# Patient Record
Sex: Female | Born: 1990 | Race: Black or African American | Hispanic: No | Marital: Single | Smoking: Never smoker
Health system: Southern US, Community
[De-identification: ages and names within clinical notes are randomized; demographics above are authoritative.]

## PROBLEM LIST (undated history)

## (undated) DIAGNOSIS — N12 Tubulo-interstitial nephritis, not specified as acute or chronic: Secondary | ICD-10-CM

## (undated) DIAGNOSIS — Z789 Other specified health status: Secondary | ICD-10-CM

---

## 2012-01-25 DIAGNOSIS — S99929A Unspecified injury of unspecified foot, initial encounter: Secondary | ICD-10-CM | POA: Insufficient documentation

## 2012-01-25 DIAGNOSIS — X500XXA Overexertion from strenuous movement or load, initial encounter: Secondary | ICD-10-CM | POA: Insufficient documentation

## 2012-01-25 DIAGNOSIS — S8990XA Unspecified injury of unspecified lower leg, initial encounter: Secondary | ICD-10-CM | POA: Insufficient documentation

## 2012-01-26 ENCOUNTER — Emergency Department (HOSPITAL_BASED_OUTPATIENT_CLINIC_OR_DEPARTMENT_OTHER)
Admission: EM | Admit: 2012-01-26 | Discharge: 2012-01-26 | Disposition: A | Discharge status: Home / Self Care | Payer: Medicaid Other | Attending: Emergency Medicine | Admitting: Emergency Medicine

## 2012-01-26 ENCOUNTER — Emergency Department (HOSPITAL_BASED_OUTPATIENT_CLINIC_OR_DEPARTMENT_OTHER): Payer: Medicaid Other

## 2012-01-26 ENCOUNTER — Encounter (HOSPITAL_BASED_OUTPATIENT_CLINIC_OR_DEPARTMENT_OTHER): Payer: Self-pay | Admitting: *Deleted

## 2012-01-26 DIAGNOSIS — S8392XA Sprain of unspecified site of left knee, initial encounter: Secondary | ICD-10-CM

## 2012-01-26 MED ORDER — NAPROXEN SODIUM 220 MG PO TABS: 220.0000 mg | ORAL_TABLET | Freq: Two times a day (BID) | ORAL | Status: AC

## 2012-01-26 MED ORDER — HYDROCODONE-ACETAMINOPHEN 5-325 MG PO TABS: 1.0000 | ORAL_TABLET | Freq: Four times a day (QID) | ORAL | Status: AC | PRN

## 2012-01-26 MED ORDER — HYDROCODONE-ACETAMINOPHEN 5-325 MG PO TABS
1.0000 | ORAL_TABLET | Freq: Once | ORAL | Status: AC
  Administered 2012-01-26: 1 via ORAL
  Filled 2012-01-26: qty 1

## 2012-01-26 MED ORDER — NAPROXEN 250 MG PO TABS
500.0000 mg | ORAL_TABLET | Freq: Once | ORAL | Status: AC
  Administered 2012-01-26: 500 mg via ORAL
  Filled 2012-01-26: qty 2
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
## 2012-01-26 NOTE — ED Provider Notes (Signed)
History     CSN: [id number]  Arrival date & time 01/25/12  2358   First MD Initiated Contact with Patient 01/26/12 0116      Chief Complaint  Patient presents with  . Knee Injury    (Consider location/radiation/quality/duration/timing/severity/associated sxs/prior treatment) HPI This is a 21 year old black female who twisted her left knee 5 days ago. Since then she has had swelling and pain in the medial aspect of the knee that has worsened. It is moderate in severity and worse with ambulation. She also complains that her knee "gives" sometimes while walking. She was seen at [facility] after the injury and was told to wear an Ace bandage. She's not on any medications for it. She denies other injury.  History reviewed. No pertinent past medical history.  History reviewed. No pertinent past surgical history.  History reviewed. No pertinent family history.  History  Substance Use Topics  . Smoking status: Never Smoker   . Smokeless tobacco: Not on file  . Alcohol Use: No    OB History    Grav Para Term Preterm Abortions TAB SAB Ect Mult Living                  Review of Systems  Allergies  Review of patient's allergies indicates no known allergies.  Home Medications  No current outpatient prescriptions on file.  BP 138/93  Pulse 83  Temp(Src) 98.3 F (36.8 C) (Oral)  Resp 19  Ht 5\' 3"  (1.6 m)  Wt 197 lb (89.359 kg)  BMI 34.90 kg/m2  SpO2 100%  Physical Exam General: Well-developed, well-nourished female in no acute distress; appearance consistent with age of record HENT: normocephalic, atraumatic Eyes: Normal appearance Neck: supple Heart: regular rate and rhythm Lungs: Normal respiratory effort and excursion Abdomen: soft; nondistended Extremities: No deformity; full range of motion; pulses normal; tenderness over left medial collateral ligament with medial pain on passive range of motion; no instability of left knee; small left knee  effusion noted Neurologic: Awake, alert and oriented; motor function intact in all extremities and symmetric; no facial droop Skin: Warm and dry Psychiatric: Normal mood and affect    ED Course  Procedures (including critical care time)     MDM  Nursing notes and vitals signs, including pulse oximetry, reviewed.  Summary of this visit's results, reviewed by myself:  Labs:  No results found for this or any previous visit.  Imaging Studies: Dg Knee Complete 4 Views Left  01/26/2012  *RADIOLOGY REPORT*  Clinical Data: Injured left knee.  LEFT KNEE - COMPLETE 4+ VIEW  Comparison: None  Findings: The joint spaces are maintained.  No acute fracture or osteochondral abnormality.  A moderate sized joint effusion is noted.  IMPRESSION:  1.  No acute bony findings. 2.  Moderate sized joint effusion.  Original Report Authenticated By: P. Loralie Champagne, M.D.            Hanley Seamen, MD 01/26/12 [phone]

## 2012-02-10 ENCOUNTER — Encounter (HOSPITAL_BASED_OUTPATIENT_CLINIC_OR_DEPARTMENT_OTHER): Payer: Self-pay | Admitting: *Deleted

## 2012-02-10 ENCOUNTER — Emergency Department (HOSPITAL_BASED_OUTPATIENT_CLINIC_OR_DEPARTMENT_OTHER)
Admission: EM | Admit: 2012-02-10 | Discharge: 2012-02-10 | Disposition: A | Discharge status: Home / Self Care | Payer: Medicaid Other | Attending: Emergency Medicine | Admitting: Emergency Medicine

## 2012-02-10 ENCOUNTER — Emergency Department (HOSPITAL_BASED_OUTPATIENT_CLINIC_OR_DEPARTMENT_OTHER): Payer: Medicaid Other

## 2012-02-10 DIAGNOSIS — S161XXA Strain of muscle, fascia and tendon at neck level, initial encounter: Secondary | ICD-10-CM

## 2012-02-10 DIAGNOSIS — R51 Headache: Secondary | ICD-10-CM | POA: Insufficient documentation

## 2012-02-10 DIAGNOSIS — S139XXA Sprain of joints and ligaments of unspecified parts of neck, initial encounter: Secondary | ICD-10-CM | POA: Insufficient documentation

## 2012-02-10 DIAGNOSIS — G8929 Other chronic pain: Secondary | ICD-10-CM | POA: Insufficient documentation

## 2012-02-10 DIAGNOSIS — M542 Cervicalgia: Secondary | ICD-10-CM | POA: Insufficient documentation

## 2012-02-10 DIAGNOSIS — M25569 Pain in unspecified knee: Secondary | ICD-10-CM | POA: Insufficient documentation

## 2012-02-10 MED ORDER — TRAMADOL HCL 50 MG PO TABS: 50.0000 mg | ORAL_TABLET | Freq: Four times a day (QID) | ORAL | Status: AC | PRN

## 2012-02-10 MED ORDER — TRAMADOL HCL 50 MG PO TABS
ORAL_TABLET | ORAL | Status: AC
  Administered 2012-02-10: 50 mg
  Filled 2012-02-10: qty 1

## 2012-02-10 NOTE — ED Notes (Signed)
Patient transported to CT 
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
## 2012-02-10 NOTE — Discharge Instructions (Signed)
Cervical Strain and Sprain (Whiplash) with Rehab Cervical strain and sprains are injuries that commonly occur with "whiplash" injuries. Whiplash occurs when the neck is forcefully whipped backward or forward, such as during a motor vehicle accident. The muscles, ligaments, tendons, discs and nerves of the neck are susceptible to injury when this occurs. SYMPTOMS   Pain or stiffness in the front and/or back of neck   Symptoms may present immediately or up to 24 hours after injury.   Dizziness, headache, nausea and vomiting.   Muscle spasm with soreness and stiffness in the neck.   Tenderness and swelling at the injury site.  CAUSES  Whiplash injuries often occur during contact sports or motor vehicle accidents.  RISK INCREASES WITH:  Osteoarthritis of the spine.   Situations that make head or neck accidents or trauma more likely.   High-risk sports (football, rugby, wrestling, hockey, auto racing, gymnastics, diving, contact karate or boxing).   Poor strength and flexibility of the neck.   Previous neck injury.   Poor tackling technique.   Improperly fitted or padded equipment.  PREVENTION  Learn and use proper technique (avoid tackling with the head, spearing and head-butting; use proper falling techniques to avoid landing on the head).   Warm up and stretch properly before activity.   Maintain physical fitness:   Strength, flexibility and endurance.   Cardiovascular fitness.   Wear properly fitted and padded protective equipment, such as padded soft collars, for participation in contact sports.  PROGNOSIS  Recovery for cervical strain and sprain injuries is dependent on the extent of the injury. These injuries are usually curable in 1 week to 3 months with appropriate treatment.  RELATED COMPLICATIONS   Temporary numbness and weakness may occur if the nerve roots are damaged, and this may persist until the nerve has completely healed.   Chronic pain due to frequent  recurrence of symptoms.   Prolonged healing, especially if activity is resumed too soon (before complete recovery).  TREATMENT  Treatment initially involves the use of ice and medication to help reduce pain and inflammation. It is also important to perform strengthening and stretching exercises and modify activities that worsen symptoms so the injury does not get worse. These exercises may be performed at home or with a therapist. For patients who experience severe symptoms, a soft padded collar may be recommended to be worn around the neck.  Improving your posture may help reduce symptoms. Posture improvement includes pulling your chin and abdomen in while sitting or standing. If you are sitting, sit in a firm chair with your buttocks against the back of the chair. While sleeping, try replacing your pillow with a small towel rolled to 2 inches in diameter, or use a cervical pillow or soft cervical collar. Poor sleeping positions delay healing.  For patients with nerve root damage, which causes numbness or weakness, the use of a cervical traction apparatus may be recommended. Surgery is rarely necessary for these injuries. However, cervical strain and sprains that are present at birth (congenital) may require surgery. MEDICATION   If pain medication is necessary, nonsteroidal anti-inflammatory medications, such as aspirin and ibuprofen, or other minor pain relievers, such as acetaminophen, are often recommended.   Do not take pain medication for 7 days before surgery.   Prescription pain relievers may be given if deemed necessary by your caregiver. Use only as directed and only as much as you need.  HEAT AND COLD:   Cold treatment (icing) relieves pain and reduces inflammation. Cold  treatment should be applied for 10 to 15 minutes every 2 to 3 hours for inflammation and pain and immediately after any activity that aggravates your symptoms. Use ice packs or an ice massage.   Heat treatment may be  used prior to performing the stretching and strengthening activities prescribed by your caregiver, physical therapist, or athletic trainer. Use a heat pack or a warm soak.  SEEK MEDICAL CARE IF:   Symptoms get worse or do not improve in 2 weeks despite treatment.   New, unexplained symptoms develop (drugs used in treatment may produce side effects).  EXERCISES RANGE OF MOTION (ROM) AND STRETCHING EXERCISES - Cervical Strain and Sprain These exercises may help you when beginning to rehabilitate your injury. In order to successfully resolve your symptoms, you must improve your posture. These exercises are designed to help reduce the forward-head and rounded-shoulder posture which contributes to this condition. Your symptoms may resolve with or without further involvement from your physician, physical therapist or athletic trainer. While completing these exercises, remember:   Restoring tissue flexibility helps normal motion to return to the joints. This allows healthier, less painful movement and activity.   An effective stretch should be held for at least 20 seconds, although you may need to begin with shorter hold times for comfort.   A stretch should never be painful. You should only feel a gentle lengthening or release in the stretched tissue.  STRETCH- Axial Extensors  Lie on your back on the floor. You may bend your knees for comfort. Place a rolled up hand towel or dish towel, about 2 inches in diameter, under the part of your head that makes contact with the floor.   Gently tuck your chin, as if trying to make a "double chin," until you feel a gentle stretch at the base of your head.   Hold __________ seconds.  Repeat __________ times. Complete this exercise __________ times per day.  STRETECH - Axial Extension   Stand or sit on a firm surface. Assume a good posture: chest up, shoulders drawn back, abdominal muscles slightly tense, knees unlocked (if standing) and feet hip width apart.    Slowly retract your chin so your head slides back and your chin slightly lowers.Continue to look straight ahead.   You should feel a gentle stretch in the back of your head. Be certain not to feel an aggressive stretch since this can cause headaches later.   Hold for __________ seconds.  Repeat __________ times. Complete this exercise __________ times per day. STRETCH - Cervical Side Bend   Stand or sit on a firm surface. Assume a good posture: chest up, shoulders drawn back, abdominal muscles slightly tense, knees unlocked (if standing) and feet hip width apart.   Without letting your nose or shoulders move, slowly tip your right / left ear to your shoulder until your feel a gentle stretch in the muscles on the opposite side of your neck.   Hold __________ seconds.  Repeat __________ times. Complete this exercise __________ times per day. STRETCH - Cervical Rotators   Stand or sit on a firm surface. Assume a good posture: chest up, shoulders drawn back, abdominal muscles slightly tense, knees unlocked (if standing) and feet hip width apart.   Keeping your eyes level with the ground, slowly turn your head until you feel a gentle stretch along the back and opposite side of your neck.   Hold __________ seconds.  Repeat __________ times. Complete this exercise __________ times per day. RANGE OF  MOTION - Neck Circles   Stand or sit on a firm surface. Assume a good posture: chest up, shoulders drawn back, abdominal muscles slightly tense, knees unlocked (if standing) and feet hip width apart.   Gently roll your head down and around from the back of one shoulder to the back of the other. The motion should never be forced or painful.   Repeat the motion 10-20 times, or until you feel the neck muscles relax and loosen.  Repeat __________ times. Complete the exercise __________ times per day. STRENGTHENING EXERCISES - Cervical Strain and Sprain These exercises may help you when beginning to  rehabilitate your injury. They may resolve your symptoms with or without further involvement from your physician, physical therapist or athletic trainer. While completing these exercises, remember:   Muscles can gain both the endurance and the strength needed for everyday activities through controlled exercises.   Complete these exercises as instructed by your physician, physical therapist or athletic trainer. Progress the resistance and repetitions only as guided.   You may experience muscle soreness or fatigue, but the pain or discomfort you are trying to eliminate should never worsen during these exercises. If this pain does worsen, stop and make certain you are following the directions exactly. If the pain is still present after adjustments, discontinue the exercise until you can discuss the trouble with your clinician.  STRENGTH - Cervical Flexors, Isometric  Face a wall, standing about 6 inches away. Place a small pillow, a ball about 6-8 inches in diameter, or a folded towel between your forehead and the wall.   Slightly tuck your chin and gently push your forehead into the soft object. Push only with mild to moderate intensity, building up tension gradually. Keep your jaw and forehead relaxed.   Hold 10 to 20 seconds. Keep your breathing relaxed.   Release the tension slowly. Relax your neck muscles completely before you start the next repetition.  Repeat __________ times. Complete this exercise __________ times per day. STRENGTH- Cervical Lateral Flexors, Isometric   Stand about 6 inches away from a wall. Place a small pillow, a ball about 6-8 inches in diameter, or a folded towel between the side of your head and the wall.   Slightly tuck your chin and gently tilt your head into the soft object. Push only with mild to moderate intensity, building up tension gradually. Keep your jaw and forehead relaxed.   Hold 10 to 20 seconds. Keep your breathing relaxed.   Release the tension  slowly. Relax your neck muscles completely before you start the next repetition.  Repeat __________ times. Complete this exercise __________ times per day. STRENGTH - Cervical Extensors, Isometric   Stand about 6 inches away from a wall. Place a small pillow, a ball about 6-8 inches in diameter, or a folded towel between the back of your head and the wall.   Slightly tuck your chin and gently tilt your head back into the soft object. Push only with mild to moderate intensity, building up tension gradually. Keep your jaw and forehead relaxed.   Hold 10 to 20 seconds. Keep your breathing relaxed.   Release the tension slowly. Relax your neck muscles completely before you start the next repetition.  Repeat __________ times. Complete this exercise __________ times per day. POSTURE AND BODY MECHANICS CONSIDERATIONS - Cervical Strain and Sprain Keeping correct posture when sitting, standing or completing your activities will reduce the stress put on different body tissues, allowing injured tissues a chance to heal  and limiting painful experiences. The following are general guidelines for improved posture. Your physician or physical therapist will provide you with any instructions specific to your needs. While reading these guidelines, remember:  The exercises prescribed by your provider will help you have the flexibility and strength to maintain correct postures.   The correct posture provides the optimal environment for your joints to work. All of your joints have less wear and tear when properly supported by a spine with good posture. This means you will experience a healthier, less painful body.   Correct posture must be practiced with all of your activities, especially prolonged sitting and standing. Correct posture is as important when doing repetitive low-stress activities (typing) as it is when doing a single heavy-load activity (lifting).  PROLONGED STANDING WHILE SLIGHTLY LEANING FORWARD When  completing a task that requires you to lean forward while standing in one place for a long time, place either foot up on a stationary 2-4 inch high object to help maintain the best posture. When both feet are on the ground, the low back tends to lose its slight inward curve. If this curve flattens (or becomes too large), then the back and your other joints will experience too much stress, fatigue more quickly and can cause pain.  RESTING POSITIONS Consider which positions are most painful for you when choosing a resting position. If you have pain with flexion-based activities (sitting, bending, stooping, squatting), choose a position that allows you to rest in a less flexed posture. You would want to avoid curling into a fetal position on your side. If your pain worsens with extension-based activities (prolonged standing, working overhead), avoid resting in an extended position such as sleeping on your stomach. Most people will find more comfort when they rest with their spine in a more neutral position, neither too rounded nor too arched. Lying on a non-sagging bed on your side with a pillow between your knees, or on your back with a pillow under your knees will often provide some relief. Keep in mind, being in any one position for a prolonged period of time, no matter how correct your posture, can still lead to stiffness. WALKING Walk with an upright posture. Your ears, shoulders and hips should all line-up. OFFICE WORK When working at a desk, create an environment that supports good, upright posture. Without extra support, muscles fatigue and lead to excessive strain on joints and other tissues. CHAIR:  A chair should be able to slide under your desk when your back makes contact with the back of the chair. This allows you to work closely.   The chair's height should allow your eyes to be level with the upper part of your monitor and your hands to be slightly lower than your elbows.   Body position:     Your feet should make contact with the floor. If this is not possible, use a foot rest.   Keep your ears over your shoulders. This will reduce stress on your neck and low back.  Document Released: 08/10/2005 Document Revised: 07/30/2011 Document Reviewed: 11/22/2008 [facility] Patient Information 2012 [place], [place].

## 2012-12-29 ENCOUNTER — Emergency Department (HOSPITAL_BASED_OUTPATIENT_CLINIC_OR_DEPARTMENT_OTHER): Payer: Medicaid Other

## 2012-12-29 ENCOUNTER — Encounter (HOSPITAL_BASED_OUTPATIENT_CLINIC_OR_DEPARTMENT_OTHER): Payer: Self-pay | Admitting: *Deleted

## 2012-12-29 ENCOUNTER — Emergency Department (HOSPITAL_BASED_OUTPATIENT_CLINIC_OR_DEPARTMENT_OTHER)
Admission: EM | Admit: 2012-12-29 | Discharge: 2012-12-29 | Disposition: A | Discharge status: Home / Self Care | Payer: Medicaid Other | Attending: Emergency Medicine | Admitting: Emergency Medicine

## 2012-12-29 DIAGNOSIS — O9989 Other specified diseases and conditions complicating pregnancy, childbirth and the puerperium: Secondary | ICD-10-CM | POA: Insufficient documentation

## 2012-12-29 DIAGNOSIS — R109 Unspecified abdominal pain: Secondary | ICD-10-CM | POA: Insufficient documentation

## 2012-12-29 DIAGNOSIS — Z3490 Encounter for supervision of normal pregnancy, unspecified, unspecified trimester: Secondary | ICD-10-CM

## 2012-12-29 LAB — WET PREP, GENITAL

## 2012-12-29 LAB — URINALYSIS, ROUTINE W REFLEX MICROSCOPIC
Bilirubin Urine: NEGATIVE
Glucose, UA: NEGATIVE mg/dL
Hgb urine dipstick: NEGATIVE
Ketones, ur: NEGATIVE mg/dL
Protein, ur: NEGATIVE mg/dL

## 2012-12-29 LAB — HCG, QUANTITATIVE, PREGNANCY: hCG, Beta Chain, Quant, S: 3937 m[IU]/mL — ABNORMAL HIGH (ref <5)

## 2012-12-29 MED ORDER — PRENATAL COMPLETE 14-0.4 MG PO TABS: ORAL_TABLET | ORAL | Status: DC

## 2012-12-29 NOTE — ED Provider Notes (Signed)
History     CSN: [id number]  Arrival date & time 12/29/12  1947   First MD Initiated Contact with Patient 12/29/12 2003      Chief Complaint  Patient presents with  . Abdominal Pain    (Consider location/radiation/quality/duration/timing/severity/associated sxs/prior treatment) Patient is a 22 y.o. female presenting with abdominal pain. The history is provided by the patient. No language interpreter was used.  Abdominal Pain Pain location:  Suprapubic Pain quality: aching   Pain radiates to:  Does not radiate Onset quality:  Gradual Chronicity:  New Relieved by:  Nothing Ineffective treatments:  None tried Pt complains of pain in her lower abdomen.  Pt reports her home pregnancy test was positive  History reviewed. No pertinent past medical history.  History reviewed. No pertinent past surgical history.  No family history on file.  History  Substance Use Topics  . Smoking status: Never Smoker   . Smokeless tobacco: Never Used  . Alcohol Use: No    OB History   Grav Para Term Preterm Abortions TAB SAB Ect Mult Living                  Review of Systems  Unable to perform ROS Gastrointestinal: Positive for abdominal pain.  All other systems reviewed and are negative.    Allergies  Review of patient's allergies indicates no known allergies.  Home Medications   Current Outpatient Rx  Name  Route  Sig  Dispense  Refill  . naproxen sodium (ALEVE) 220 MG tablet   Oral   Take 1 tablet (220 mg total) by mouth 2 (two) times daily with a meal.           BP 154/107  Pulse 90  Temp(Src) 98.7 F (37.1 C) (Oral)  Resp 20  SpO2 98%  LMP 11/28/2012  Physical Exam  Nursing note and vitals reviewed. HENT:  Head: Normocephalic.  Right Ear: External ear normal.  Left Ear: External ear normal.  Eyes: Conjunctivae and EOM are normal. Pupils are equal, round, and reactive to light.  Neck: Normal range of motion. Neck supple.  Cardiovascular: Normal rate and  normal heart sounds.   Pulmonary/Chest: Effort normal and breath sounds normal.  Abdominal: Soft. Bowel sounds are normal.  Genitourinary: Vagina normal and uterus normal.  Musculoskeletal: Normal range of motion.  Skin: Skin is warm.    ED Course  Procedures (including critical care time)  Labs Reviewed  PREGNANCY, URINE - Abnormal; Notable for the following:    Preg Test, Ur POSITIVE (*)    All other components within normal limits  URINALYSIS, ROUTINE W REFLEX MICROSCOPIC - Abnormal; Notable for the following:    Specific Gravity, Urine 1.037 (*)    All other components within normal limits  WET PREP, GENITAL  GC/CHLAMYDIA PROBE AMP   No results found.   1. Pregnancy       MDM   Results for orders placed during the hospital encounter of 12/29/12  PREGNANCY, URINE      Result Value Range   Preg Test, Ur POSITIVE (*) NEGATIVE  URINALYSIS, ROUTINE W REFLEX MICROSCOPIC      Result Value Range   Color, Urine YELLOW  YELLOW   APPearance CLEAR  CLEAR   Specific Gravity, Urine 1.037 (*) 1.005 - 1.030   pH 5.5  5.0 - 8.0   Glucose, UA NEGATIVE  NEGATIVE mg/dL   Hgb urine dipstick NEGATIVE  NEGATIVE   Bilirubin Urine NEGATIVE  NEGATIVE   Ketones, ur NEGATIVE  NEGATIVE mg/dL   Protein, ur NEGATIVE  NEGATIVE mg/dL   Urobilinogen, UA 0.2  0.0 - 1.0 mg/dL   Nitrite NEGATIVE  NEGATIVE   Leukocytes, UA NEGATIVE  NEGATIVE   US Ob Comp Less 14 Wks  12/29/2012  *RADIOLOGY REPORT*  Clinical Data: Abdominal pain  OBSTETRIC <14 WK ULTRASOUND  Technique:  Transabdominal ultrasound was performed for evaluation of the gestation as well as the maternal uterus and adnexal regions.  Comparison:  None.  Intrauterine gestational sac: Single sac like structure within the fundal aspect of the endometrium. Yolk sac: Not identified Embryo: Not identified  MSD: 3.6 mm  5 w  1 d               [place] EDC: 08/30/2013  Maternal uterus/Adnexae: Normal ovaries.  No free fluid.  IMPRESSION: Probable early  intrauterine gestational sac, but no yolk sac, fetal pole, or cardiac activity yet visualized.  Recommend follow-up quantitative B-HCG levels and follow-up US in 14 days to confirm and assess viability. This recommendation follows SRU consensus guidelines: Diagnostic Criteria for Nonviable Pregnancy Early in the First Trimester.  Malva Limes Med 2013; 161:0960-45.   Original Report Authenticated By: Genevive Bi, M.D.    US Ob Transvaginal  12/29/2012  *RADIOLOGY REPORT*  Clinical Data: Abdominal pain  OBSTETRIC <14 WK ULTRASOUND  Technique:  Transabdominal ultrasound was performed for evaluation of the gestation as well as the maternal uterus and adnexal regions.  Comparison:  None.  Intrauterine gestational sac: Single sac like structure within the fundal aspect of the endometrium. Yolk sac: Not identified Embryo: Not identified  MSD: 3.6 mm  5 w  1 d               [place] EDC: 08/30/2013  Maternal uterus/Adnexae: Normal ovaries.  No free fluid.  IMPRESSION: Probable early intrauterine gestational sac, but no yolk sac, fetal pole, or cardiac activity yet visualized.  Recommend follow-up quantitative B-HCG levels and follow-up US in 14 days to confirm and assess viability. This recommendation follows SRU consensus guidelines: Diagnostic Criteria for Nonviable Pregnancy Early in the First Trimester.  Malva Limes Med 2013; 409:8119-14.   Original Report Authenticated By: Genevive Bi, M.D.       Pt advised of results.  Pt advised to follow up at [facility] hospital in 2 days for repeat bhcg and 2 week ultrasound     Elson Areas, PA-C 12/29/12 2236

## 2012-12-29 NOTE — ED Provider Notes (Signed)
Medical screening examination/treatment/procedure(s) were performed by non-physician practitioner and as supervising physician I was immediately available for consultation/collaboration.   Dent Plantz, MD 12/29/12 2253 

## 2012-12-29 NOTE — ED Notes (Signed)
Patient transported to Ultrasound 

## 2012-12-29 NOTE — ED Notes (Signed)
abd pain since Monday- pt reports 2 positive home preg tests

## 2012-12-31 LAB — GC/CHLAMYDIA PROBE AMP: CT Probe RNA: NEGATIVE
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
## 2013-10-20 ENCOUNTER — Encounter (HOSPITAL_COMMUNITY): Payer: Self-pay | Admitting: *Deleted

## 2013-10-20 ENCOUNTER — Inpatient Hospital Stay (HOSPITAL_COMMUNITY)
Admission: AD | Admit: 2013-10-20 | Discharge: 2013-10-20 | Disposition: A | Discharge status: Home / Self Care | Payer: Medicaid Other | Source: Ambulatory Visit | Attending: Obstetrics & Gynecology | Admitting: Obstetrics & Gynecology

## 2013-10-20 DIAGNOSIS — N938 Other specified abnormal uterine and vaginal bleeding: Secondary | ICD-10-CM | POA: Insufficient documentation

## 2013-10-20 DIAGNOSIS — N939 Abnormal uterine and vaginal bleeding, unspecified: Secondary | ICD-10-CM

## 2013-10-20 DIAGNOSIS — N760 Acute vaginitis: Secondary | ICD-10-CM

## 2013-10-20 DIAGNOSIS — B9689 Other specified bacterial agents as the cause of diseases classified elsewhere: Secondary | ICD-10-CM

## 2013-10-20 DIAGNOSIS — A499 Bacterial infection, unspecified: Secondary | ICD-10-CM | POA: Insufficient documentation

## 2013-10-20 DIAGNOSIS — N949 Unspecified condition associated with female genital organs and menstrual cycle: Secondary | ICD-10-CM | POA: Insufficient documentation

## 2013-10-20 HISTORY — DX: Other specified health status: Z78.9

## 2013-10-20 LAB — CBC
HCT: 37.8 % (ref 36.0–46.0)
Hemoglobin: 13 g/dL (ref 12.0–15.0)
MCH: 29.5 pg (ref 26.0–34.0)
MCHC: 34.4 g/dL (ref 30.0–36.0)
MCV: 85.7 fL (ref 78.0–100.0)
PLATELETS: 285 10*3/uL (ref 150–400)
RBC: 4.41 MIL/uL (ref 3.87–5.11)
RDW: 15 % (ref 11.5–15.5)
WBC: 6.7 10*3/uL (ref 4.0–10.5)

## 2013-10-20 LAB — URINALYSIS, ROUTINE W REFLEX MICROSCOPIC
BILIRUBIN URINE: NEGATIVE
GLUCOSE, UA: NEGATIVE mg/dL
Ketones, ur: NEGATIVE mg/dL
Leukocytes, UA: NEGATIVE
Nitrite: NEGATIVE
PH: 5.5 (ref 5.0–8.0)
Protein, ur: NEGATIVE mg/dL
UROBILINOGEN UA: 0.2 mg/dL (ref 0.0–1.0)

## 2013-10-20 LAB — URINE MICROSCOPIC-ADD ON

## 2013-10-20 LAB — WET PREP, GENITAL
TRICH WET PREP: NONE SEEN
YEAST WET PREP: NONE SEEN

## 2013-10-20 LAB — POCT PREGNANCY, URINE: PREG TEST UR: NEGATIVE

## 2013-10-20 MED ORDER — METRONIDAZOLE 500 MG PO TABS: 500.0000 mg | ORAL_TABLET | Freq: Two times a day (BID) | ORAL | Status: DC

## 2013-10-20 NOTE — MAU Provider Note (Signed)
History     CSN: [id number]  Arrival date and time: 10/20/13 1814   First Provider Initiated Contact with Patient 10/20/13 1915      Chief Complaint  Patient presents with  . Vaginal Discharge  . Vaginal Bleeding   HPI  [name] is a 23 y.o. female G2P1011 who presents with concerns of vaginal discharge and vaginal bleeding. She delivered via c-section on January 7th at [facility]. She believes she started her period in February; the bleeding lasted for 7 days. Two days ago she started spotting again, along with an odorous discharge. She is not concerned about STDS; she is breast feeding.   OB History   Grav Para Term Preterm Abortions TAB SAB Ect Mult Living   2 1 1  1     1       Past Medical History  Diagnosis Date  . Medical history non-contributory     Past Surgical History  Procedure Laterality Date  . Cesarean section      History reviewed. No pertinent family history.  History  Substance Use Topics  . Smoking status: Never Smoker   . Smokeless tobacco: Never Used  . Alcohol Use: No    Allergies: No Known Allergies  Prescriptions prior to admission  Medication Sig Dispense Refill  . Prenatal Vit-Fe Fumarate-FA (PRENATAL MULTIVITAMIN) TABS tablet Take 1 tablet by mouth daily at 12 noon.       Results for orders placed during the hospital encounter of 10/20/13 (from the past 48 hour(s))  POCT PREGNANCY, URINE     Status: None   Collection Time    10/20/13  7:01 PM      Result Value Ref Range   Preg Test, Ur NEGATIVE  NEGATIVE   Comment:            THE SENSITIVITY OF THIS     METHODOLOGY IS >24 mIU/mL   Results for orders placed during the hospital encounter of 10/20/13 (from the past 48 hour(s))  URINALYSIS, ROUTINE W REFLEX MICROSCOPIC     Status: Abnormal   Collection Time    10/20/13  6:55 PM      Result Value Ref Range   Color, Urine YELLOW  YELLOW   APPearance CLEAR  CLEAR   Specific Gravity, Urine >1.030 (*) 1.005 - 1.030   pH  5.5  5.0 - 8.0   Glucose, UA NEGATIVE  NEGATIVE mg/dL   Hgb urine dipstick LARGE (*) NEGATIVE   Bilirubin Urine NEGATIVE  NEGATIVE   Ketones, ur NEGATIVE  NEGATIVE mg/dL   Protein, ur NEGATIVE  NEGATIVE mg/dL   Urobilinogen, UA 0.2  0.0 - 1.0 mg/dL   Nitrite NEGATIVE  NEGATIVE   Leukocytes, UA NEGATIVE  NEGATIVE  URINE MICROSCOPIC-ADD ON     Status: Abnormal   Collection Time    10/20/13  6:55 PM      Result Value Ref Range   Squamous Epithelial / LPF MANY (*) RARE   WBC, UA 0-2  <3 WBC/hpf   RBC / HPF 0-2  <3 RBC/hpf   Bacteria, UA FEW (*) RARE  POCT PREGNANCY, URINE     Status: None   Collection Time    10/20/13  7:01 PM      Result Value Ref Range   Preg Test, Ur NEGATIVE  NEGATIVE   Comment:            THE SENSITIVITY OF THIS     METHODOLOGY IS >24 mIU/mL  WET PREP, GENITAL  Status: Abnormal   Collection Time    10/20/13  7:30 PM      Result Value Ref Range   Yeast Wet Prep HPF POC NONE SEEN  NONE SEEN   Trich, Wet Prep NONE SEEN  NONE SEEN   Clue Cells Wet Prep HPF POC FEW (*) NONE SEEN   WBC, Wet Prep HPF POC RARE (*) NONE SEEN   Comment: FEW BACTERIA SEEN  CBC     Status: None   Collection Time    10/20/13  7:30 PM      Result Value Ref Range   WBC 6.7  4.0 - 10.5 K/uL   RBC 4.41  3.87 - 5.11 MIL/uL   Hemoglobin 13.0  12.0 - 15.0 g/dL   HCT 16.1  09.6 - 04.5 %   MCV 85.7  78.0 - 100.0 fL   MCH 29.5  26.0 - 34.0 pg   MCHC 34.4  30.0 - 36.0 g/dL   RDW 40.9  81.1 - 91.4 %   Platelets 285  150 - 400 K/uL    Review of Systems  Constitutional: Positive for chills. Negative for fever.  Gastrointestinal: Negative for nausea, vomiting, abdominal pain, diarrhea and constipation.  Genitourinary: Negative for dysuria, urgency, frequency and hematuria.       + vaginal discharge. + vaginal bleeding. No dysuria.    Physical Exam   Blood pressure 131/97, pulse 67, temperature 98.6 F (37 C), temperature source Oral, resp. rate 18, height 5\' 3"  (1.6 m), weight  105.688 kg (233 lb), last menstrual period 10/04/2013, SpO2 100.00%.  Physical Exam  Constitutional: She is oriented to person, place, and time. She appears well-developed and well-nourished. No distress.  HENT:  Head: Normocephalic.  Eyes: Pupils are equal, round, and reactive to light.  Neck: Neck supple.  Respiratory: Effort normal.  GI: Soft. She exhibits no distension and no mass. There is no tenderness. There is no rebound and no guarding.  Genitourinary: Vaginal discharge found.  Speculum exam: Vagina - Scant amount of dark red blood, strong odor  Cervix - No contact bleeding, no active bleeding  Bimanual exam: Cervix closed, no CMT  Uterus non tender, normal size Adnexa non tender, no masses bilaterally GC/Chlam, wet prep done Chaperone present for exam.   Musculoskeletal: Normal range of motion.  Neurological: She is alert and oriented to person, place, and time.  Skin: Skin is warm. She is not diaphoretic.  Psychiatric: Her behavior is normal.    MAU Course  Procedures None  MDM CBC UPT UA Wet prep GC/Chlamydia- pending   Assessment and Plan   A:  Irregular vaginal bleeding  Bacterial vaginosis   P:  Discharge home in stable condition RX: Flagyl  Return to MAU as needed if symptoms worsen  Continue prenatal vitamins   Priscilla Hansen Adreena Willits, NP  10/23/2013, 4:38 PM

## 2013-10-20 NOTE — Discharge Instructions (Signed)

## 2013-10-20 NOTE — MAU Note (Signed)
Patient states she had a cesarean section on 1-7. States she thinks she had a period on 2-16 then started spotting 2 days ago. Has had a vaginal discharge with odor for about one week.

## 2013-10-21 LAB — GC/CHLAMYDIA PROBE AMP
CT Probe RNA: NEGATIVE
GC Probe RNA: NEGATIVE

## 2014-02-25 ENCOUNTER — Emergency Department (HOSPITAL_BASED_OUTPATIENT_CLINIC_OR_DEPARTMENT_OTHER)
Admission: EM | Admit: 2014-02-25 | Discharge: 2014-02-25 | Discharge status: Against Medical Advice | Payer: Medicaid Other | Attending: Emergency Medicine | Admitting: Emergency Medicine

## 2014-02-25 ENCOUNTER — Encounter (HOSPITAL_BASED_OUTPATIENT_CLINIC_OR_DEPARTMENT_OTHER): Payer: Self-pay | Admitting: Emergency Medicine

## 2014-02-25 DIAGNOSIS — M549 Dorsalgia, unspecified: Secondary | ICD-10-CM | POA: Diagnosis not present

## 2014-02-25 NOTE — ED Notes (Signed)
Patient has been called and didn't answer. 

## 2014-02-25 NOTE — ED Notes (Signed)
Reports since delivering in January, pt has back pain.  Reports pain is daily but the severity is different each day. Denies taking medication. Denies urinary symptoms.

## 2014-06-25 ENCOUNTER — Encounter (HOSPITAL_BASED_OUTPATIENT_CLINIC_OR_DEPARTMENT_OTHER): Payer: Self-pay | Admitting: Emergency Medicine

## 2015-09-07 ENCOUNTER — Encounter (HOSPITAL_BASED_OUTPATIENT_CLINIC_OR_DEPARTMENT_OTHER): Payer: Self-pay | Admitting: Emergency Medicine

## 2015-09-07 ENCOUNTER — Emergency Department (HOSPITAL_BASED_OUTPATIENT_CLINIC_OR_DEPARTMENT_OTHER)
Admission: EM | Admit: 2015-09-07 | Discharge: 2015-09-07 | Disposition: A | Discharge status: Home / Self Care | Payer: Medicaid Other | Attending: Emergency Medicine | Admitting: Emergency Medicine

## 2015-09-07 ENCOUNTER — Emergency Department (HOSPITAL_BASED_OUTPATIENT_CLINIC_OR_DEPARTMENT_OTHER): Payer: Medicaid Other

## 2015-09-07 DIAGNOSIS — N201 Calculus of ureter: Secondary | ICD-10-CM | POA: Insufficient documentation

## 2015-09-07 DIAGNOSIS — Z3202 Encounter for pregnancy test, result negative: Secondary | ICD-10-CM | POA: Diagnosis not present

## 2015-09-07 DIAGNOSIS — R109 Unspecified abdominal pain: Secondary | ICD-10-CM | POA: Diagnosis present

## 2015-09-07 LAB — URINE MICROSCOPIC-ADD ON

## 2015-09-07 LAB — URINALYSIS, ROUTINE W REFLEX MICROSCOPIC
Bilirubin Urine: NEGATIVE
GLUCOSE, UA: NEGATIVE mg/dL
KETONES UR: NEGATIVE mg/dL
LEUKOCYTES UA: NEGATIVE
NITRITE: NEGATIVE
PH: 6 (ref 5.0–8.0)
Protein, ur: NEGATIVE mg/dL
Specific Gravity, Urine: 1.027 (ref 1.005–1.030)

## 2015-09-07 LAB — CBC
HEMATOCRIT: 38.8 % (ref 36.0–46.0)
HEMOGLOBIN: 13.2 g/dL (ref 12.0–15.0)
MCH: 29.9 pg (ref 26.0–34.0)
MCHC: 34 g/dL (ref 30.0–36.0)
MCV: 87.8 fL (ref 78.0–100.0)
Platelets: 288 10*3/uL (ref 150–400)
RBC: 4.42 MIL/uL (ref 3.87–5.11)
RDW: 14.6 % (ref 11.5–15.5)
WBC: 8.9 10*3/uL (ref 4.0–10.5)

## 2015-09-07 LAB — COMPREHENSIVE METABOLIC PANEL
ALBUMIN: 4.7 g/dL (ref 3.5–5.0)
ALT: 24 U/L (ref 14–54)
ANION GAP: 10 (ref 5–15)
AST: 48 U/L — ABNORMAL HIGH (ref 15–41)
Alkaline Phosphatase: 55 U/L (ref 38–126)
BUN: 12 mg/dL (ref 6–20)
CHLORIDE: 109 mmol/L (ref 101–111)
CO2: 19 mmol/L — AB (ref 22–32)
Calcium: 9.4 mg/dL (ref 8.9–10.3)
Creatinine, Ser: 0.91 mg/dL (ref 0.44–1.00)
GFR calc non Af Amer: >60 mL/min (ref >60)
GLUCOSE: 125 mg/dL — AB (ref 65–99)
POTASSIUM: 3.7 mmol/L (ref 3.5–5.1)
SODIUM: 138 mmol/L (ref 135–145)
Total Bilirubin: 0.5 mg/dL (ref 0.3–1.2)
Total Protein: 8 g/dL (ref 6.5–8.1)

## 2015-09-07 LAB — PREGNANCY, URINE: Preg Test, Ur: NEGATIVE

## 2015-09-07 LAB — LIPASE, BLOOD: LIPASE: 28 U/L (ref 11–51)

## 2015-09-07 MED ORDER — TAMSULOSIN HCL 0.4 MG PO CAPS: 0.4000 mg | ORAL_CAPSULE | Freq: Every day | ORAL | Status: AC

## 2015-09-07 MED ORDER — ONDANSETRON HCL 4 MG/2ML IJ SOLN
4.0000 mg | Freq: Once | INTRAMUSCULAR | Status: AC
  Administered 2015-09-07: 4 mg via INTRAVENOUS
  Filled 2015-09-07: qty 2

## 2015-09-07 MED ORDER — OXYCODONE-ACETAMINOPHEN 5-325 MG PO TABS: 1.0000 | ORAL_TABLET | Freq: Four times a day (QID) | ORAL | Status: AC | PRN

## 2015-09-07 MED ORDER — KETOROLAC TROMETHAMINE 30 MG/ML IJ SOLN
30.0000 mg | Freq: Once | INTRAMUSCULAR | Status: AC
  Administered 2015-09-07: 30 mg via INTRAVENOUS
  Filled 2015-09-07: qty 1

## 2015-09-07 MED ORDER — NAPROXEN 500 MG PO TABS: 500.0000 mg | ORAL_TABLET | Freq: Two times a day (BID) | ORAL | Status: AC

## 2015-09-07 MED ORDER — HYDROMORPHONE HCL 1 MG/ML IJ SOLN
1.0000 mg | Freq: Once | INTRAMUSCULAR | Status: AC
  Administered 2015-09-07: 1 mg via INTRAVENOUS
  Filled 2015-09-07: qty 1

## 2015-09-07 NOTE — ED Notes (Signed)
Pt thrashing around in chair, reports inability to ambulate due to pain. Pt was informed to give urine sample, at which time she stood up and stated that it hurt to sit and that she could not give a sample. Pt ambulated to bathroom independently

## 2015-09-07 NOTE — ED Provider Notes (Signed)
CSN: [id number]     Arrival date & time 09/07/15  1454 History   First MD Initiated Contact with Patient 09/07/15 1627     Chief Complaint  Patient presents with  . Abdominal Pain     (Consider location/radiation/quality/duration/timing/severity/associated sxs/prior Treatment) HPI Comments: Patient with no serious past medical problems presents with complaint of left flank pain starting acutely one hour prior to arrival. Patient started having urinary frequency yesterday. No hematuria. No vaginal symptoms. No fevers. Patient has vomited multiple times. Onset of symptoms acute. Course is constant. Nothing makes symptoms better or worse. No previous abdominal surgeries other than cesarean section.  The history is provided by the patient.    Past Medical History  Diagnosis Date  . Medical history non-contributory    Past Surgical History  Procedure Laterality Date  . Cesarean section     History reviewed. No pertinent family history. Social History  Substance Use Topics  . Smoking status: Never Smoker   . Smokeless tobacco: Never Used  . Alcohol Use: No   OB History    Gravida Para Term Preterm AB TAB SAB Ectopic Multiple Living   2 1 1  1     1      Review of Systems  Constitutional: Negative for fever.  HENT: Negative for rhinorrhea and sore throat.   Eyes: Negative for redness.  Respiratory: Negative for cough.   Cardiovascular: Negative for chest pain.  Gastrointestinal: Negative for nausea, vomiting, abdominal pain and diarrhea.  Genitourinary: Positive for frequency and flank pain. Negative for dysuria.  Musculoskeletal: Negative for myalgias.  Skin: Negative for rash.  Neurological: Negative for headaches.    Allergies  Review of patient's allergies indicates no known allergies.  Home Medications   Prior to Admission medications   Not on File   BP 160/97 mmHg  Pulse 68  Temp(Src) 98.4 F (36.9 C) (Oral)  Resp 18  Ht 5\' 3"  (1.6 m)  Wt 103.874 kg  BMI  40.58 kg/m2  SpO2 100%  LMP 08/21/2015   Physical Exam  Constitutional: She appears well-developed and well-nourished. She appears distressed.  HENT:  Head: Normocephalic and atraumatic.  Eyes: Conjunctivae are normal. Right eye exhibits no discharge. Left eye exhibits no discharge.  Neck: Normal range of motion. Neck supple.  Cardiovascular: Normal rate, regular rhythm and normal heart sounds.   Pulmonary/Chest: Effort normal and breath sounds normal. No respiratory distress. She has no wheezes. She has no rales.  Abdominal: Soft. There is no tenderness. There is no rebound and no guarding.  Neurological: She is alert.  Skin: Skin is warm and dry.  Psychiatric: She has a normal mood and affect.  Nursing note and vitals reviewed.   ED Course  Procedures (including critical care time) Labs Review Labs Reviewed  URINALYSIS, ROUTINE W REFLEX MICROSCOPIC (NOT AT [facility]) - Abnormal; Notable for the following:    APPearance CLOUDY (*)    Hgb urine dipstick LARGE (*)    All other components within normal limits  COMPREHENSIVE METABOLIC PANEL - Abnormal; Notable for the following:    CO2 19 (*)    Glucose, Bld 125 (*)    AST 48 (*)    All other components within normal limits  URINE MICROSCOPIC-ADD ON - Abnormal; Notable for the following:    Squamous Epithelial / LPF 6-30 (*)    Bacteria, UA MANY (*)    All other components within normal limits  PREGNANCY, URINE  LIPASE, BLOOD  CBC    Imaging Review  Ct Renal Stone Study  09/07/2015  CLINICAL DATA:  Left flank pain and increased urinary frequency for 1 day EXAM: CT ABDOMEN AND PELVIS WITHOUT CONTRAST TECHNIQUE: Multidetector CT imaging of the abdomen and pelvis was performed following the standard protocol without oral or intravenous contrast material administration. COMPARISON:  None. FINDINGS: Lower chest:  Lung bases are clear.  There is a focal hiatal hernia. Hepatobiliary: No focal liver lesions are identified on this  noncontrast enhanced study. Gallbladder wall is not appreciably thickened. There is no biliary duct dilatation. Pancreas: No pancreatic mass or inflammatory focus. Spleen: No splenic lesions are identified. Adrenals/Urinary Tract: No adrenal lesions are identified. There is no renal mass on either side. There is no hydronephrosis on the right. There is moderate hydronephrosis on the left. There is no intrarenal calculus on either side. There is no ureteral calculus on the right. However, on the left, there is a 4 mm calculus at the left ureterovesical junction. Urinary bladder is largely decompressed. The with wall thickness in the urinary bladder is felt to be within normal limits given the degree of bladder contraction. Stomach/Bowel: There is no bowel wall or mesenteric thickening. No bowel obstruction. No free air or portal venous air. Vascular/Lymphatic: There is no demonstrable abdominal aortic aneurysm. No vascular lesions are identified on this noncontrast enhanced study. There is no adenopathy by size criteria in the abdomen pelvis. Scattered subcentimeter periaortic and mesenteric lymph nodes are regarded as nonspecific. Reproductive: There is an intrauterine device within the endometrium. Uterus is anteverted. There is no pelvic mass or pelvic fluid collection. Other: Appendix appears normal. There is no ascites or abscess in the abdomen or pelvis. There is a small ventral hernia containing only fat. Musculoskeletal: There are no blastic or lytic bone lesions. No intramuscular or abdominal wall lesion. IMPRESSION: 4 mm calculus left ureterovesical junction with moderate hydronephrosis on the left. No intrarenal calculi on either side. No hydronephrosis on the right. No bowel obstruction.  No abscess.  Appendix appears normal. There is a focal hiatal hernia. There is a small ventral hernia containing only fat. Intrauterine device within the endometrium. Electronically Signed   By: Bretta Bang III  M.D.   On: 09/07/2015 17:47   I have personally reviewed and evaluated these images and lab results as part of my medical decision-making.   EKG Interpretation None       Patient seen and examined. Work-up initiated. Medications ordered.   Vital signs reviewed and are as follows: BP 160/97 mmHg  Pulse 68  Temp(Src) 98.4 F (36.9 C) (Oral)  Resp 18  Ht 5\' 3"  (1.6 m)  Wt 103.874 kg  BMI 40.58 kg/m2  SpO2 100%  LMP 08/21/2015  7:19 PM Patient counseled on kidney stone treatment. Urged patient to strain urine and save any stones. Urged urology follow-up and return to [facility] with any complications. Counseled patient to maintain good fluid intake.   Counseled patient on use of Flomax.   Patient counseled on use of narcotic pain medications. Counseled not to combine these medications with others containing tylenol. Urged not to drink alcohol, drive, or perform any other activities that requires focus while taking these medications. The patient verbalizes understanding and agrees with the plan.   MDM   Final diagnoses:  Left flank pain  Ureteral stone   Patient with exam and workup consistent with left-sided ureteral stone. No urinary tract infection. Normal kidney function. Pain is well controlled in emergency department with standard therapy. Discharge home as above  with urology follow-up.   Renne CriglerJoshua Kierah Goatley, PA-C 09/07/15 1920  Glynn OctaveStephen Rancour, MD 09/07/15 2239

## 2015-09-07 NOTE — Discharge Instructions (Signed)
Please read and follow all provided instructions.  Your diagnoses today include:  1. Ureteral stone   2. Left flank pain     Tests performed today include:  Urine test that showed blood in your urine and no infection  CT scan which showed a 4 millimeter kidney on the left side  Blood test that showed normal kidney function  Vital signs. See below for your results today.   Medications prescribed:   Percocet (oxycodone/acetaminophen) - narcotic pain medication  DO NOT drive or perform any activities that require you to be awake and alert because this medicine can make you drowsy. BE VERY CAREFUL not to take multiple medicines containing Tylenol (also called acetaminophen). Doing so can lead to an overdose which can damage your liver and cause liver failure and possibly death.   Naproxen - anti-inflammatory pain medication  Do not exceed 500mg  naproxen every 12 hours, take with food  You have been prescribed an anti-inflammatory medication or NSAID. Take with food. Take smallest effective dose for the shortest duration needed for your pain. Stop taking if you experience stomach pain or vomiting.    Flomax (tamsulosin) - relaxes smooth muscle to help kidney stones pass  Take any prescribed medications only as directed.  Home care instructions:  Follow any educational materials contained in this packet.  Please double your fluid intake for the next several days. Strain your urine and save any stones that may pass.   BE VERY CAREFUL not to take multiple medicines containing Tylenol (also called acetaminophen). Doing so can lead to an overdose which can damage your liver and cause liver failure and possibly death.   Follow-up instructions: Please follow-up with your urologist or the urologist referral (provided on front page) in the next 1 week for further evaluation of your symptoms.  If you need to return to the Emergency Department, go to [facility] and not [facility]. The [facility]rolog[facility] are [facility] and can better care for you at this location.  Return instructions:  If you need to return to the Emergency Department, go to [facility] and [facility] . The [facility]rolog[facility] are [facility] and can better care for you at this location.   Please return to the Emergency Department if you experience worsening symptoms.  Please return if you develop fever or uncontrolled pain or vomiting.  Please return if you have any other emergent concerns.  Additional Information:  Your vital signs today were: BP 139/100 mmHg   Pulse 64   Temp(Src) 98.4 F (36.9 C) (Oral)   Resp 18   Ht 5\' 3"  (1.6 m)   Wt 103.874 kg   BMI 40.58 kg/m2   SpO2 100%   LMP 08/21/2015 If your blood pressure (BP) was elevated above 135/85 this visit, please have this repeated by your doctor within one month. --------------

## 2015-09-07 NOTE — ED Notes (Signed)
Tolerating fluids well.   

## 2015-09-07 NOTE — ED Notes (Signed)
Pt with increased urinary frequency since yesterday, states she has never had problem before, reports bowel movement this am and afterwards developed back pain that radiated to pelvis

## 2015-09-07 NOTE — ED Notes (Signed)
Pt screaming and crying -- taken to triage room for lab work to be drawn.

## 2015-09-07 NOTE — ED Notes (Signed)
Patient is up in the room and pacing around, moaning loudly in pain. Medications pushed through IV no complaints, patient complaints of burning in IV post medications. No swelling noted at the site, the patient has good blood return to IV.

## 2018-05-12 ENCOUNTER — Emergency Department (HOSPITAL_BASED_OUTPATIENT_CLINIC_OR_DEPARTMENT_OTHER)
Admission: EM | Admit: 2018-05-12 | Discharge: 2018-05-12 | Disposition: A | Discharge status: Home / Self Care | Payer: Self-pay | Attending: Emergency Medicine | Admitting: Emergency Medicine

## 2018-05-12 ENCOUNTER — Encounter (HOSPITAL_BASED_OUTPATIENT_CLINIC_OR_DEPARTMENT_OTHER): Payer: Self-pay

## 2018-05-12 ENCOUNTER — Other Ambulatory Visit: Payer: Self-pay

## 2018-05-12 DIAGNOSIS — R319 Hematuria, unspecified: Secondary | ICD-10-CM | POA: Insufficient documentation

## 2018-05-12 DIAGNOSIS — N390 Urinary tract infection, site not specified: Secondary | ICD-10-CM | POA: Insufficient documentation

## 2018-05-12 DIAGNOSIS — Z79899 Other long term (current) drug therapy: Secondary | ICD-10-CM | POA: Insufficient documentation

## 2018-05-12 HISTORY — DX: Tubulo-interstitial nephritis, not specified as acute or chronic: N12

## 2018-05-12 LAB — URINALYSIS, MICROSCOPIC (REFLEX)

## 2018-05-12 LAB — URINALYSIS, ROUTINE W REFLEX MICROSCOPIC
BILIRUBIN URINE: NEGATIVE
GLUCOSE, UA: NEGATIVE mg/dL
Ketones, ur: NEGATIVE mg/dL
Nitrite: NEGATIVE
PH: 5.5 (ref 5.0–8.0)
Protein, ur: 30 mg/dL — AB
Specific Gravity, Urine: >1.03 — ABNORMAL HIGH (ref 1.005–1.030)

## 2018-05-12 LAB — PREGNANCY, URINE: PREG TEST UR: NEGATIVE

## 2018-05-12 MED ORDER — CEPHALEXIN 250 MG PO CAPS
500.0000 mg | ORAL_CAPSULE | Freq: Once | ORAL | Status: AC
  Administered 2018-05-12: 500 mg via ORAL
  Filled 2018-05-12: qty 2

## 2018-05-12 MED ORDER — CEPHALEXIN 500 MG PO CAPS: 500.0000 mg | ORAL_CAPSULE | Freq: Three times a day (TID) | ORAL | 0 refills | Status: AC

## 2018-05-12 NOTE — ED Provider Notes (Signed)
[facility] EMERGENCY DEPARTMENT Provider Note   CSN: [id number] Arrival date & time: 05/12/18  2148     History   Chief Complaint Chief Complaint  Patient presents with  . Urinary Frequency    HPI Priscilla AusKintajah Green is a 27 y.o. female. She presents with a two day history of urinary frequency. She also notes a "tingling" feeling at the end of urination. Pt states she was concerned she had a kidney stone which prompted her to come in. She denies any back pain, abdominal pain, nausea, vomiting, hematuria. Pt notes she is currently on her period.  The history is provided by the patient.  Urinary Frequency  This is a new problem. The current episode started 2 days ago. The problem has not changed since onset.Pertinent negatives include no chest pain, no abdominal pain and no shortness of breath.    Past Medical History:  Diagnosis Date  . Medical history non-contributory   . Pyelonephritis     There are no active problems to display for this patient.   Past Surgical History:  Procedure Laterality Date  . CESAREAN SECTION       OB History    Gravida  2   Para  1   Term  1   Preterm      AB  1   Living  1     SAB      TAB      Ectopic      Multiple      Live Births               Home Medications    Prior to Admission medications   Medication Sig Start Date End Date Taking? Authorizing Provider  cephALEXin (KEFLEX) 500 MG capsule Take 1 capsule (500 mg total) by mouth 3 (three) times daily for 5 days. 05/12/18 05/17/18  Corayma Cashatt S, PA-C  naproxen (NAPROSYN) 500 MG tablet Take 1 tablet (500 mg total) by mouth 2 (two) times daily. 09/07/15   Renne CriglerGeiple, Joshua, PA-C  oxyCODONE-acetaminophen (PERCOCET/ROXICET) 5-325 MG tablet Take 1-2 tablets by mouth every 6 (six) hours as needed for severe pain. 09/07/15   Renne CriglerGeiple, Joshua, PA-C  tamsulosin (FLOMAX) 0.4 MG CAPS capsule Take 1 capsule (0.4 mg total) by mouth daily. 09/07/15   Renne CriglerGeiple, Joshua,  PA-C    Family History No family history on file.  Social History Social History   Tobacco Use  . Smoking status: Never Smoker  . Smokeless tobacco: Never Used  Substance Use Topics  . Alcohol use: No  . Drug use: No     Allergies   Patient has no known allergies.   Review of Systems Review of Systems  Constitutional: Negative for chills and fever.  HENT: Negative for rhinorrhea and sore throat.   Eyes: Negative for visual disturbance.  Respiratory: Negative for cough and shortness of breath.   Cardiovascular: Negative for chest pain and leg swelling.  Gastrointestinal: Negative for abdominal pain, diarrhea, nausea and vomiting.  Genitourinary: Positive for dysuria (tingling feeling at the end of urination) and frequency. Negative for urgency and vaginal pain.  Skin: Negative for rash and wound.  All other systems reviewed and are negative.    Physical Exam Updated Vital Signs BP (!) 151/91 (BP Location: Left Arm)   Pulse 75   Temp 99.4 F (37.4 C) (Oral)   Resp 18   Ht 5\' 3"  (1.6 m)   Wt 83.9 kg   LMP 04/19/2018   SpO2 100%  BMI 32.77 kg/m   Physical Exam  Constitutional: She is oriented to person, place, and time. She appears well-developed and well-nourished.  HENT:  Head: Normocephalic and atraumatic.  Eyes: Conjunctivae and EOM are normal.  Neck: Neck supple.  Cardiovascular: Normal rate, regular rhythm and normal heart sounds.  No murmur heard. Pulmonary/Chest: Effort normal and breath sounds normal. No respiratory distress. She has no wheezes. She has no rales.  Abdominal: Soft. Bowel sounds are normal. She exhibits no distension. There is no tenderness.  Musculoskeletal: Normal range of motion. She exhibits no tenderness or deformity.  Neurological: She is alert and oriented to person, place, and time.  Skin: Skin is warm and dry. No rash noted. No erythema.  Psychiatric: She has a normal mood and affect. Her behavior is normal.  Nursing note  and vitals reviewed.    ED Treatments / Results  Labs (all labs ordered are listed, but only abnormal results are displayed) Labs Reviewed  URINALYSIS, ROUTINE W REFLEX MICROSCOPIC - Abnormal; Notable for the following components:      Result Value   APPearance CLOUDY (*)    Specific Gravity, Urine >1.030 (*)    Hgb urine dipstick LARGE (*)    Protein, ur 30 (*)    Leukocytes, UA SMALL (*)    All other components within normal limits  URINALYSIS, MICROSCOPIC (REFLEX) - Abnormal; Notable for the following components:   Bacteria, UA MANY (*)    All other components within normal limits  URINE CULTURE  PREGNANCY, URINE    EKG None  Radiology No results found.  Procedures Procedures (including critical care time)  Medications Ordered in ED Medications - No data to display   Initial Impression / Assessment and Plan / ED Course  I have reviewed the triage vital signs and the nursing notes.  Pertinent labs & imaging results that were available during my care of the patient were reviewed by me and considered in my medical decision making (see chart for details).     Pt has been diagnosed with a UTI. Pt is afebrile, no CVA tenderness and denies N/V. Pt to be dc home with antibiotics and instructions to follow up with PCP if symptoms persist. At this time there does not appear to be any evidence of an acute emergency medical condition and the patient appears stable for discharge with appropriate outpatient follow up.Diagnosis was discussed with patient who verbalizes understanding and is agreeable to discharge.   Final Clinical Impressions(s) / ED Diagnoses   Final diagnoses:  Urinary tract infection with hematuria, site unspecified    ED Discharge Orders         Ordered    cephALEXin (KEFLEX) 500 MG capsule  3 times daily     05/12/18 2240           Clayborne Artist, PA-C 05/12/18 2351    Melene Plan, DO 05/13/18 [phone]

## 2018-05-12 NOTE — ED Notes (Signed)
Pt verbalizes understanding of d/c instructions and denies any further needs at this time. 

## 2018-05-12 NOTE — ED Triage Notes (Signed)
Urinary frequency for the last two days with the feeling like she's not emptying her bladder, denies flank pain, denies fever

## 2018-05-12 NOTE — Discharge Instructions (Signed)
Take all keflex as prescribed. Return to the ED for new or worsening symptoms, such as abdominal pain, back pain, fever, vomiting or any concerns at all.

## 2018-05-15 LAB — URINE CULTURE

## 2018-05-16 ENCOUNTER — Telehealth: Payer: Self-pay | Admitting: Emergency Medicine

## 2018-05-16 ENCOUNTER — Telehealth (HOSPITAL_COMMUNITY): Payer: Self-pay | Admitting: Pharmacist

## 2018-05-16 NOTE — Progress Notes (Signed)
ED Antimicrobial Stewardship Positive Culture Follow Up   Priscilla Green is an 27 y.o. female who presented to [facility] on (Not on file) with a chief complaint of No chief complaint on file.   Recent Results (from the past 720 hour(s))  Urine culture     Status: Abnormal   Collection Time: 05/12/18 10:10 PM  Result Value Ref Range Status   Specimen Description   Final    URINE, CLEAN CATCH Performed at [facility] [place], [address] [place], [place] [address]    Special Requests   Final    NONE Performed at [facility] [place], [address] [place], [place] [address]    Culture >=100,000 COLONIES/mL ENTEROBACTER AEROGENES (A)  Final   Report Status 05/15/2018 FINAL  Final   Organism ID, Bacteria ENTEROBACTER AEROGENES (A)  Final      Susceptibility   Enterobacter aerogenes - MIC*    CEFAZOLIN >=64 RESISTANT Resistant     CEFTRIAXONE <=1 SENSITIVE Sensitive     CIPROFLOXACIN <=0.25 SENSITIVE Sensitive     GENTAMICIN <=1 SENSITIVE Sensitive     IMIPENEM 0.5 SENSITIVE Sensitive     NITROFURANTOIN <=16 SENSITIVE Sensitive     TRIMETH/SULFA <=20 SENSITIVE Sensitive     PIP/TAZO <=4 SENSITIVE Sensitive     * >=100,000 COLONIES/mL ENTEROBACTER AEROGENES    [x]  Treated with Keflex, organism resistant to prescribed antimicrobial []  Patient discharged originally without antimicrobial agent and treatment is now indicated  New antibiotic prescription: Macrobid 100 mg po BID x 5 days  ED Provider: Malachi CarlH. Khatri, PA-C   MastersDarl Green, Priscilla Green 05/16/2018, 2:47 PM Clinical Pharmacist Monday - Friday phone -  [phone] Saturday - Sunday phone - [phone]

## 2018-05-16 NOTE — Telephone Encounter (Signed)
Post ED Visit - Positive Culture Follow-up: Unsuccessful Patient Follow-up  Culture assessed and recommendations reviewed by:  []  Enzo BiNathan Batchelder, Pharm.D. []  Celedonio MiyamotoJeremy Frens, Pharm.D., BCPS AQ-ID []  Garvin FilaMike Maccia, Pharm.D., BCPS []  Georgina PillionElizabeth Martin, Pharm.D., BCPS []  [place] Pham, [address] BCPS, AAHIVP []  Estella HuskMichelle Turner, Pharm.D., BCPS, AAHIVP []  Sherlynn CarbonAustin Lucas, PharmD [x]  Andres LabrumAllison McMasters, PharmD, BCPS  Positive Urine culture  []  Patient discharged without antimicrobial prescription and treatment is now indicated [x]  Organism is resistant to prescribed ED discharge antimicrobial []  Patient with positive blood cultures  Stop Keflex, Start Macrobid 100 mg PO BID x five days. Prescriber: Dietrich PatesHina Khatri, PA  Unable to contact patient by phone, letter will be sent to address on file  Norm ParcelShannon Clee Pandit 05/16/2018, 4:28 PM

## 2018-06-27 ENCOUNTER — Telehealth: Payer: Self-pay | Admitting: Emergency Medicine

## 2018-06-27 NOTE — Telephone Encounter (Signed)
Lost to followup 

## 2019-02-06 ENCOUNTER — Other Ambulatory Visit: Payer: Self-pay

## 2019-02-06 ENCOUNTER — Emergency Department (HOSPITAL_BASED_OUTPATIENT_CLINIC_OR_DEPARTMENT_OTHER)
Admission: EM | Admit: 2019-02-06 | Discharge: 2019-02-06 | Disposition: A | Discharge status: Home / Self Care | Payer: Self-pay | Attending: Emergency Medicine | Admitting: Emergency Medicine

## 2019-02-06 ENCOUNTER — Encounter (HOSPITAL_BASED_OUTPATIENT_CLINIC_OR_DEPARTMENT_OTHER): Payer: Self-pay | Admitting: *Deleted

## 2019-02-06 DIAGNOSIS — I10 Essential (primary) hypertension: Secondary | ICD-10-CM | POA: Insufficient documentation

## 2019-02-06 DIAGNOSIS — H538 Other visual disturbances: Secondary | ICD-10-CM | POA: Insufficient documentation

## 2019-02-06 LAB — BASIC METABOLIC PANEL
Anion gap: 7 (ref 5–15)
BUN: 15 mg/dL (ref 6–20)
CO2: 25 mmol/L (ref 22–32)
Calcium: 8.9 mg/dL (ref 8.9–10.3)
Chloride: 104 mmol/L (ref 98–111)
Creatinine, Ser: 0.79 mg/dL (ref 0.44–1.00)
GFR calc Af Amer: >60 mL/min (ref >60)
GFR calc non Af Amer: >60 mL/min (ref >60)
Glucose, Bld: 97 mg/dL (ref 70–99)
Potassium: 3.7 mmol/L (ref 3.5–5.1)
Sodium: 136 mmol/L (ref 135–145)

## 2019-02-06 LAB — CBC
HCT: 37.7 % (ref 36.0–46.0)
Hemoglobin: 12.5 g/dL (ref 12.0–15.0)
MCH: 31.1 pg (ref 26.0–34.0)
MCHC: 33.2 g/dL (ref 30.0–36.0)
MCV: 93.8 fL (ref 80.0–100.0)
Platelets: 214 10*3/uL (ref 150–400)
RBC: 4.02 MIL/uL (ref 3.87–5.11)
RDW: 13.6 % (ref 11.5–15.5)
WBC: 4.6 10*3/uL (ref 4.0–10.5)
nRBC: 0 % (ref 0.0–0.2)

## 2019-02-06 LAB — PREGNANCY, URINE: Preg Test, Ur: NEGATIVE

## 2019-02-06 MED ORDER — HYDROCHLOROTHIAZIDE 12.5 MG PO CAPS: 12.5000 mg | ORAL_CAPSULE | Freq: Every day | ORAL | 0 refills | Status: AC

## 2019-02-06 MED ORDER — HYDROCHLOROTHIAZIDE 25 MG PO TABS
12.5000 mg | ORAL_TABLET | Freq: Once | ORAL | Status: AC
  Administered 2019-02-06: 12.5 mg via ORAL
  Filled 2019-02-06: qty 1

## 2019-02-06 NOTE — ED Provider Notes (Signed)
[facility] EMERGENCY DEPARTMENT Provider Note   CSN: [id number] Arrival date & time: 02/06/19  2035    History   Chief Complaint Chief Complaint  Patient presents with  . Blurred Vision    HPI Priscilla Green is a 28 y.o. female.     HPI Patient reports last week she was seen at her GYN office and her blood pressures were measured in 150s over 90s.  She reports prior to that she had really not been monitoring her blood pressure.  She reports during her last pregnancy she had a few elevated blood pressures but was not treated with medications.  After her delivery, she lost weight and exercised.  Pressure seem to be controlled.  Reports that today she was exercising at the gym.  She had done all of her usual activities.  She did not get any chest pain, lightheadedness, headache or weakness or numbness.  She walked out to her car and as she was preparing to leave the gym she noticed there was some areas in her vision seemed slightly blurry.  Was no loss of vision, no dark areas.  No headache, no incoordination, no weakness numbness tingling of extremities.  She got in her car and measured her blood pressure and found that it was 160/100.  She reports at that point she became concerned and came to the emergency department.  Reports her mother has had hypertension since she was a young adult.  She is the oldest of 4 siblings and none of them have hypertension at this point. Past Medical History:  Diagnosis Date  . Medical history non-contributory   . Pyelonephritis     There are no active problems to display for this patient.   Past Surgical History:  Procedure Laterality Date  . CESAREAN SECTION       OB History    Gravida  2   Para  1   Term  1   Preterm      AB  1   Living  1     SAB      TAB      Ectopic      Multiple      Live Births               Home Medications    Prior to Admission medications   Medication Sig Start Date End Date  Taking? Authorizing Provider  hydrochlorothiazide (MICROZIDE) 12.5 MG capsule Take 1 capsule (12.5 mg total) by mouth daily. 02/06/19   Arby BarrettePfeiffer, Soraiya Ahner, MD  naproxen (NAPROSYN) 500 MG tablet Take 1 tablet (500 mg total) by mouth 2 (two) times daily. 09/07/15   Renne CriglerGeiple, Joshua, PA-C  oxyCODONE-acetaminophen (PERCOCET/ROXICET) 5-325 MG tablet Take 1-2 tablets by mouth every 6 (six) hours as needed for severe pain. 09/07/15   Renne CriglerGeiple, Joshua, PA-C  tamsulosin (FLOMAX) 0.4 MG CAPS capsule Take 1 capsule (0.4 mg total) by mouth daily. 09/07/15   Renne CriglerGeiple, Joshua, PA-C    Family History No family history on file.  Social History Social History   Tobacco Use  . Smoking status: Never Smoker  . Smokeless tobacco: Never Used  Substance Use Topics  . Alcohol use: No  . Drug use: No     Allergies   Patient has no known allergies.   Review of Systems Review of Systems 10 Systems reviewed and are negative for acute change except as noted in the HPI.   Physical Exam Updated Vital Signs BP (!) 145/103 (BP Location: Right Arm)  Pulse (!) 58   Temp 98.3 F (36.8 C) (Oral)   Resp 18   Ht 5\' 3"  (1.6 m)   Wt 86.3 kg   SpO2 100%   BMI 33.71 kg/m   Physical Exam Constitutional:      Appearance: She is well-developed.  HENT:     Head: Normocephalic and atraumatic.     Mouth/Throat:     Mouth: Mucous membranes are moist.     Pharynx: Oropharynx is clear.  Eyes:     Extraocular Movements: Extraocular movements intact.     Conjunctiva/sclera: Conjunctivae normal.     Pupils: Pupils are equal, round, and reactive to light.  Neck:     Musculoskeletal: Neck supple.  Cardiovascular:     Rate and Rhythm: Normal rate and regular rhythm.     Heart sounds: Normal heart sounds.  Pulmonary:     Effort: Pulmonary effort is normal.     Breath sounds: Normal breath sounds.  Abdominal:     General: Bowel sounds are normal. There is no distension.     Palpations: Abdomen is soft.     Tenderness:  There is no abdominal tenderness.  Musculoskeletal: Normal range of motion.        General: No swelling or tenderness.     Right lower leg: No edema.     Left lower leg: No edema.  Skin:    General: Skin is warm and dry.  Neurological:     General: No focal deficit present.     Mental Status: She is alert and oriented to person, place, and time.     GCS: GCS eye subscore is 4. GCS verbal subscore is 5. GCS motor subscore is 6.     Cranial Nerves: No cranial nerve deficit.     Sensory: No sensory deficit.     Coordination: Coordination normal.  Psychiatric:        Mood and Affect: Mood normal.      ED Treatments / Results  Labs (all labs ordered are listed, but only abnormal results are displayed) Labs Reviewed  PREGNANCY, URINE  BASIC METABOLIC PANEL  CBC    EKG EKG Interpretation  Date/Time:  Monday February 06 2019 21:33:49 EDT Ventricular Rate:  57 PR Interval:    QRS Duration: 93 QT Interval:  398 QTC Calculation: 388 R Axis:   57 Text Interpretation:  Sinus rhythm normal, no old comparison Confirmed by Charlesetta Shanks [phone]) on 02/06/2019 10:59:31 PM   Radiology No results found.  Procedures Procedures (including critical care time)  Medications Ordered in ED Medications  hydrochlorothiazide (HYDRODIURIL) tablet 12.5 mg (12.5 mg Oral Given 02/06/19 2252)     Initial Impression / Assessment and Plan / ED Course  I have reviewed the triage vital signs and the nursing notes.  Pertinent labs & imaging results that were available during my care of the patient were reviewed by me and considered in my medical decision making (see chart for details).       Patient is well in appearance.  She had isolated blurred vision after working out.  She did not have any symptoms while exerting herself.  She did not have lightheadedness or near syncope.  No other neurologic symptoms.  She has diastolic hypertension which seems to be consistent.  She was measured last week  with elevated diastolic pressure.  EKG and basic labs are within normal limits.  Will start on low-dose of hydrochlorothiazide.  Patient has already been working on arrangements for follow-up for  monitoring.  Counseled on return symptoms.  Final Clinical Impressions(s) / ED Diagnoses   Final diagnoses:  Essential hypertension  Vision blurred    ED Discharge Orders         Ordered    hydrochlorothiazide (MICROZIDE) 12.5 MG capsule  Daily     02/06/19 2238           Arby BarrettePfeiffer, Hervey Wedig, MD 02/06/19 2302

## 2019-02-06 NOTE — ED Triage Notes (Signed)
Her BP was high last week. Today after working out in the gym she started seeing spots. She felt her BP was high. The spots have improved but are still there. She is ambulatory.

## 2019-02-06 NOTE — Discharge Instructions (Signed)
1.  Start hydrochlorothiazide as prescribed.  Follow instructions for diet and management of hypertension.  Follow-up with a primary doctor as soon as possible.  Return to the emergency department if you have severe headache, worsening blurred vision, chest pain or other concerning symptoms.

## 2024-02-01 ENCOUNTER — Ambulatory Visit (INDEPENDENT_AMBULATORY_CARE_PROVIDER_SITE_OTHER): Admitting: Nurse Practitioner

## 2024-02-01 ENCOUNTER — Encounter: Payer: Self-pay | Admitting: Nurse Practitioner

## 2024-02-01 VITALS — BP 147/91 | HR 63 | Temp 97.6°F | Ht 63.5 in | Wt 208.0 lb

## 2024-02-01 DIAGNOSIS — E66812 Obesity, class 2: Secondary | ICD-10-CM | POA: Diagnosis not present

## 2024-02-01 DIAGNOSIS — Z6836 Body mass index (BMI) 36.0-36.9, adult: Secondary | ICD-10-CM

## 2024-02-01 DIAGNOSIS — I10 Essential (primary) hypertension: Secondary | ICD-10-CM | POA: Diagnosis not present

## 2024-02-01 NOTE — Progress Notes (Signed)
 Office: [phone]  /  Fax: [phone]   Initial Visit  Priscilla Green was seen in clinic today to evaluate for obesity. She is interested in losing weight to improve overall health and reduce the risk of weight related complications. She presents today to review program treatment options, initial physical assessment, and evaluation.     She was referred by: Self-Referral  When asked what else they would like to accomplish? She states: Adopt healthier eating patterns and Improve quality of life   When asked how has your weight affected you? She states: Contributed to orthopedic problems or mobility issues, Having fatigue, and Having poor endurance  Some associated conditions: ADHD, HTN  Contributing factors: family history of obesity, freq traveler   Weight promoting medications identified: None  Current nutrition plan: 1700-1800 calories, and 130 grams of protein, 150 grams of carbs, < 75 grams of fat  Current level of physical activity: going to the gym 4-5 days per week-cardio and resistance training.  Current or previous pharmacotherapy: None  Response to medication: Never tried medications   Past medical history includes:   Past Medical History:  Diagnosis Date   Medical history non-contributory    Pyelonephritis      Objective:   BP (!) 147/91   Pulse 63   Temp 97.6 F (36.4 C)   Ht 5' 3.5" (1.613 m)   Wt 208 lb (94.3 kg)   SpO2 100%   BMI 36.27 kg/m  She was weighed on the bioimpedance scale: Body mass index is 36.27 kg/m.  Peak Weight:250 lbs , Body Fat%:41, Visceral Fat Rating:9, Weight trend over the last 12 months: fluctuated  General:  Alert, oriented and cooperative. Patient is in no acute distress.  Respiratory: Normal respiratory effort, no problems with respiration noted   Gait: able to ambulate independently  Mental Status: Normal mood and affect. Normal behavior. Normal judgment and thought content.   DIAGNOSTIC DATA  REVIEWED:  BMET    Component Value Date/Time   NA 136 02/06/2019 2128   K 3.7 02/06/2019 2128   CL 104 02/06/2019 2128   CO2 25 02/06/2019 2128   GLUCOSE 97 02/06/2019 2128   BUN 15 02/06/2019 2128   CREATININE 0.79 02/06/2019 2128   CALCIUM 8.9 02/06/2019 2128   GFRNONAA >60 02/06/2019 2128   GFRAA >60 02/06/2019 2128   No results found for: "HGBA1C" No results found for: "INSULIN" CBC    Component Value Date/Time   WBC 4.6 02/06/2019 2128   RBC 4.02 02/06/2019 2128   HGB 12.5 02/06/2019 2128   HCT 37.7 02/06/2019 2128   PLT 214 02/06/2019 2128   MCV 93.8 02/06/2019 2128   MCH 31.1 02/06/2019 2128   MCHC 33.2 02/06/2019 2128   RDW 13.6 02/06/2019 2128   Iron/TIBC/Ferritin/ %Sat No results found for: "IRON", "TIBC", "FERRITIN", "IRONPCTSAT" Lipid Panel  No results found for: "CHOL", "TRIG", "HDL", "CHOLHDL", "VLDL", "LDLCALC", "LDLDIRECT" Hepatic Function Panel     Component Value Date/Time   PROT 8.0 09/07/2015 1545   ALBUMIN 4.7 09/07/2015 1545   AST 48 (H) 09/07/2015 1545   ALT 24 09/07/2015 1545   ALKPHOS 55 09/07/2015 1545   BILITOT 0.5 09/07/2015 1545   No results found for: "TSH"   Assessment and Plan:   Hypertension, unspecified type Continue to follow up with PCP.  Continue med as directed  Class 2 severe obesity due to excess calories with serious comorbidity and body mass index (BMI) of 36.0 to 36.9 in adult [facility])  Obesity Treatment / Action Plan:  Patient will work on garnering support from family and friends to begin weight loss journey. Will work on eliminating or reducing the presence of highly palatable, calorie dense foods in the home. Will complete provided nutritional and psychosocial assessment questionnaire before the next appointment. Will be scheduled for indirect calorimetry to determine resting energy expenditure in a fasting state.  This will allow us  to create a reduced calorie, high-protein meal plan to promote loss of  fat mass while preserving muscle mass. Counseled on the health benefits of losing 5%-15% of total body weight. Was counseled on nutritional approaches to weight loss and benefits of reducing processed foods and consuming plant-based foods and high quality protein as part of nutritional weight management. Was counseled on pharmacotherapy and role as an adjunct in weight management.   Obesity Education Performed Today:  She was weighed on the bioimpedance scale and results were discussed and documented in the synopsis.  We discussed obesity as a disease and the importance of a more detailed evaluation of all the factors contributing to the disease.  We discussed the importance of long term lifestyle changes which include nutrition, exercise and behavioral modifications as well as the importance of customizing this to her specific health and social needs.  We discussed the benefits of reaching a healthier weight to alleviate the symptoms of existing conditions and reduce the risks of the biomechanical, metabolic and psychological effects of obesity.  Elsbeth Yearick appears to be in the action stage of change and states they are ready to start intensive lifestyle modifications and behavioral modifications.  30 minutes was spent today on this visit including the above counseling, pre-visit chart review, and post-visit documentation.  Reviewed by clinician on day of visit: allergies, medications, problem list, medical history, surgical history, family history, social history, and previous encounter notes pertinent to obesity diagnosis.    Crist Dominion Liliah Dorian FNP-C
